# Patient Record
Sex: Male | Born: 1955 | Race: White | Hispanic: No | Marital: Married | Smoking: Never smoker
Health system: Southern US, Community
[De-identification: ages and names within clinical notes are randomized; demographics above are authoritative.]

## PROBLEM LIST (undated history)

## (undated) DIAGNOSIS — N200 Calculus of kidney: Secondary | ICD-10-CM

## (undated) DIAGNOSIS — K219 Gastro-esophageal reflux disease without esophagitis: Secondary | ICD-10-CM

---

## 2000-12-04 ENCOUNTER — Emergency Department (HOSPITAL_COMMUNITY): Admission: EM | Admit: 2000-12-04 | Discharge: 2000-12-04 | Payer: Self-pay

## 2006-05-19 ENCOUNTER — Emergency Department (HOSPITAL_COMMUNITY): Admission: EM | Admit: 2006-05-19 | Discharge: 2006-05-19 | Payer: Self-pay | Admitting: Emergency Medicine

## 2009-04-12 ENCOUNTER — Emergency Department (HOSPITAL_COMMUNITY): Admission: EM | Admit: 2009-04-12 | Discharge: 2009-04-12 | Payer: Self-pay | Admitting: Emergency Medicine

## 2009-09-06 DIAGNOSIS — R059 Cough, unspecified: Secondary | ICD-10-CM | POA: Insufficient documentation

## 2009-09-06 DIAGNOSIS — R05 Cough: Secondary | ICD-10-CM

## 2009-09-06 DIAGNOSIS — N200 Calculus of kidney: Secondary | ICD-10-CM | POA: Insufficient documentation

## 2010-09-25 LAB — BASIC METABOLIC PANEL
CO2: 23 mEq/L (ref 19–32)
Calcium: 9.3 mg/dL (ref 8.4–10.5)
Chloride: 103 mEq/L (ref 96–112)
GFR calc Af Amer: >60 mL/min (ref >60)
Sodium: 136 mEq/L (ref 135–145)

## 2010-09-25 LAB — URINE MICROSCOPIC-ADD ON

## 2010-09-25 LAB — URINALYSIS, ROUTINE W REFLEX MICROSCOPIC
Bilirubin Urine: NEGATIVE
Glucose, UA: NEGATIVE mg/dL
Ketones, ur: NEGATIVE mg/dL
Specific Gravity, Urine: 1.012 (ref 1.005–1.030)
pH: 7 (ref 5.0–8.0)

## 2013-05-22 ENCOUNTER — Ambulatory Visit
Admission: RE | Admit: 2013-05-22 | Discharge: 2013-05-22 | Disposition: A | Discharge status: Home / Self Care | Payer: 59 | Source: Ambulatory Visit | Attending: Family Medicine | Admitting: Family Medicine

## 2013-05-22 ENCOUNTER — Other Ambulatory Visit: Payer: Self-pay | Admitting: Family Medicine

## 2013-05-22 DIAGNOSIS — N5089 Other specified disorders of the male genital organs: Secondary | ICD-10-CM

## 2016-10-18 DIAGNOSIS — R5383 Other fatigue: Secondary | ICD-10-CM | POA: Diagnosis not present

## 2016-10-18 DIAGNOSIS — R1084 Generalized abdominal pain: Secondary | ICD-10-CM | POA: Diagnosis not present

## 2016-10-25 DIAGNOSIS — R1084 Generalized abdominal pain: Secondary | ICD-10-CM | POA: Diagnosis not present

## 2016-10-26 DIAGNOSIS — R1084 Generalized abdominal pain: Secondary | ICD-10-CM | POA: Diagnosis not present

## 2016-12-28 ENCOUNTER — Encounter (HOSPITAL_COMMUNITY): Payer: Self-pay | Admitting: Emergency Medicine

## 2016-12-28 DIAGNOSIS — N1330 Unspecified hydronephrosis: Secondary | ICD-10-CM | POA: Diagnosis not present

## 2016-12-28 DIAGNOSIS — N201 Calculus of ureter: Secondary | ICD-10-CM | POA: Insufficient documentation

## 2016-12-28 DIAGNOSIS — R1032 Left lower quadrant pain: Secondary | ICD-10-CM | POA: Diagnosis not present

## 2016-12-28 NOTE — ED Triage Notes (Signed)
Pt states about 45 minutes ago he had a sudden onset of pain in his left flank  Pt states he had nausea with it  Pt states the pain has eased off now but he has hx of kidney stones

## 2016-12-29 ENCOUNTER — Emergency Department (HOSPITAL_COMMUNITY): Payer: 59

## 2016-12-29 ENCOUNTER — Emergency Department (HOSPITAL_COMMUNITY)
Admission: EM | Admit: 2016-12-29 | Discharge: 2016-12-29 | Disposition: A | Discharge status: Home / Self Care | Payer: 59 | Attending: Emergency Medicine | Admitting: Emergency Medicine

## 2016-12-29 DIAGNOSIS — N1330 Unspecified hydronephrosis: Secondary | ICD-10-CM | POA: Diagnosis not present

## 2016-12-29 DIAGNOSIS — N201 Calculus of ureter: Secondary | ICD-10-CM

## 2016-12-29 HISTORY — DX: Gastro-esophageal reflux disease without esophagitis: K21.9

## 2016-12-29 HISTORY — DX: Calculus of kidney: N20.0

## 2016-12-29 LAB — URINALYSIS, ROUTINE W REFLEX MICROSCOPIC
Bilirubin Urine: NEGATIVE
Glucose, UA: NEGATIVE mg/dL
HGB URINE DIPSTICK: NEGATIVE
KETONES UR: NEGATIVE mg/dL
LEUKOCYTES UA: NEGATIVE
Nitrite: NEGATIVE
PROTEIN: NEGATIVE mg/dL
Specific Gravity, Urine: 1.018 (ref 1.005–1.030)
pH: 6 (ref 5.0–8.0)

## 2016-12-29 MED ORDER — HYDROMORPHONE HCL 1 MG/ML IJ SOLN
1.0000 mg | Freq: Once | INTRAMUSCULAR | Status: AC
  Administered 2016-12-29: 1 mg via INTRAVENOUS
  Filled 2016-12-29: qty 1

## 2016-12-29 MED ORDER — HYDROMORPHONE HCL 4 MG PO TABS: 4.0000 mg | ORAL_TABLET | ORAL | 0 refills | Status: AC | PRN

## 2016-12-29 MED ORDER — SODIUM CHLORIDE 0.9 % IV SOLN
Freq: Once | INTRAVENOUS | Status: AC
  Administered 2016-12-29: 02:00:00 via INTRAVENOUS

## 2016-12-29 MED ORDER — ONDANSETRON 8 MG PO TBDP: 8.0000 mg | ORAL_TABLET | Freq: Three times a day (TID) | ORAL | 0 refills | Status: AC | PRN

## 2016-12-29 MED ORDER — KETOROLAC TROMETHAMINE 30 MG/ML IJ SOLN
10.0000 mg | Freq: Once | INTRAMUSCULAR | Status: AC
  Administered 2016-12-29: 9.9 mg via INTRAVENOUS
  Filled 2016-12-29: qty 1

## 2016-12-29 MED ORDER — ONDANSETRON HCL 4 MG/2ML IJ SOLN
4.0000 mg | Freq: Once | INTRAMUSCULAR | Status: AC
  Administered 2016-12-29: 4 mg via INTRAVENOUS
  Filled 2016-12-29: qty 2

## 2016-12-29 NOTE — ED Notes (Signed)
Pt's wife to registration window and states her husband is laying in the lobby bathroom floor  Assisted pt to wheelchair  Pt is diaphoretic and states the pain in his flank area has returned  Pt is nauseated

## 2016-12-29 NOTE — ED Provider Notes (Signed)
[facility] DEPT Provider Note: Georgena Spurling, MD, FACEP  CSN: [id number] MRN: [id number] ARRIVAL: 12/28/16 at 61 ROOM: WA24/WA24   CHIEF COMPLAINT  Flank Pain   HISTORY OF PRESENT ILLNESS  Andres Tanner is a 61 y.o. male with a history of kidney stones. He is here with left flank pain that began about 2 hours ago. He rates the pain as severe, the worst has ever felt. The flank pain radiates into his left lower quadrant. It is not significantly changed with movement. He has had associated nausea, vomiting and diaphoresis. He is not aware of having a fever.  Consultation with the [facility] state controlled substances database reveals the patient has received no opioid prescriptions in the past year.   Past Medical History:  Diagnosis Date  . GERD (gastroesophageal reflux disease)   . Kidney stone     History reviewed. No pertinent surgical history.  Family History  Problem Relation Age of Onset  . Diabetes Mother   . CAD Mother   . Hypertension Mother   . Leukemia Father     Social History  Substance Use Topics  . Smoking status: Never Smoker  . Smokeless tobacco: Never Used  . Alcohol use Yes    Prior to Admission medications   Medication Sig Start Date End Date Taking? Authorizing Provider  fluticasone (FLONASE) 50 MCG/ACT nasal spray Place 1-2 sprays into both nostrils daily as needed for rhinitis.   Yes [provider]    Allergies Patient has no known allergies.   REVIEW OF SYSTEMS  Negative except as noted here or in the History of Present Illness.   PHYSICAL EXAMINATION  Initial Vital Signs Blood pressure (!) 176/101, pulse 80, temperature (!) 97.5 F (36.4 C), temperature source Oral, resp. rate 18, SpO2 98 %.  Examination General: Well-developed, well-nourished male in obvious discomfort; appearance consistent with age of record HENT: normocephalic; atraumatic Eyes: pupils equal, round and reactive to light; extraocular muscles  intact Neck: supple Heart: regular rate and rhythm Lungs: clear to auscultation bilaterally Abdomen: soft; nondistended; left lower quadrant tenderness; bowel sounds present GU: Left CVA tenderness Extremities: No deformity; full range of motion; pulses normal Neurologic: Awake, alert and oriented; motor function intact in all extremities and symmetric; no facial droop Skin: Clammy Psychiatric: Mildly agitated   RESULTS  Summary of this visit's results, reviewed by myself:   EKG Interpretation  Date/Time:    Ventricular Rate:    PR Interval:    QRS Duration:   QT Interval:    QTC Calculation:   R Axis:     Text Interpretation:        Laboratory Studies: Results for orders placed or performed during the hospital encounter of 12/29/16 (from the past 24 hour(s))  Urinalysis, Routine w reflex microscopic- may I&O cath if menses     Status: None   Collection Time: 12/28/16 11:49 PM  Result Value Ref Range   Color, Urine YELLOW YELLOW   APPearance CLEAR CLEAR   Specific Gravity, Urine 1.018 1.005 - 1.030   pH 6.0 5.0 - 8.0   Glucose, UA NEGATIVE NEGATIVE mg/dL   Hgb urine dipstick NEGATIVE NEGATIVE   Bilirubin Urine NEGATIVE NEGATIVE   Ketones, ur NEGATIVE NEGATIVE mg/dL   Protein, ur NEGATIVE NEGATIVE mg/dL   Nitrite NEGATIVE NEGATIVE   Leukocytes, UA NEGATIVE NEGATIVE   Imaging Studies: Ct Renal Stone Study  Result Date: 12/29/2016 CLINICAL DATA:  Left flank pain EXAM: CT ABDOMEN AND PELVIS WITHOUT CONTRAST TECHNIQUE: Multidetector  CT imaging of the abdomen and pelvis was performed following the standard protocol without IV contrast. COMPARISON:  04/12/2009 FINDINGS: Lower chest: Lung bases demonstrate streaky atelectasis at the left base. No focal consolidation or pleural effusion. Mild coronary artery calcification. Hepatobiliary: Subcentimeter hypodensity left hepatic lobe probably a cyst. No calcified gallstones or biliary dilatation. Pancreas: Unremarkable. No  pancreatic ductal dilatation or surrounding inflammatory changes. Spleen: Normal in size without focal abnormality. Adrenals/Urinary Tract: Adrenal glands are within normal limits. Moderate left perinephric fat stranding. Mild left hydronephrosis and hydroureter, secondary to a punctate stone at the left UVJ. Bladder otherwise normal Stomach/Bowel: Stomach is within normal limits. Appendix appears normal. No evidence of bowel wall thickening, distention, or inflammatory changes. Vascular/Lymphatic: Aortic atherosclerosis. No enlarged abdominal or pelvic lymph nodes. Reproductive: Prostate is unremarkable. Other: No free air or free fluid. Musculoskeletal: Degenerative changes. No acute or suspicious bone lesion. IMPRESSION: 1. Mild left hydronephrosis and hydroureter, secondary to a punctate stone at the left UVJ. There is moderate left perinephric fat stranding Electronically Signed   By: Donavan Foil M.D.   On: 12/29/2016 02:45    ED COURSE  Nursing notes and initial vitals signs, including pulse oximetry, reviewed.  Vitals:   12/28/16 2336 12/29/16 0152  BP: (!) 176/101 (!) 190/97  Pulse: 80 76  Resp: 18 18  Temp: (!) 97.5 F (36.4 C)   TempSrc: Oral   SpO2: 98% 95%   3:36 AM Pain well controlled this time. Patient advised of CT findings.  PROCEDURES    ED DIAGNOSES     ICD-10-CM   1. Ureterolithiasis N20.1        Ana Woodroof, MD 12/29/16 [phone]

## 2017-03-16 DIAGNOSIS — Z0000 Encounter for general adult medical examination without abnormal findings: Secondary | ICD-10-CM | POA: Diagnosis not present

## 2017-04-09 DIAGNOSIS — M5136 Other intervertebral disc degeneration, lumbar region: Secondary | ICD-10-CM | POA: Diagnosis not present

## 2017-04-09 DIAGNOSIS — M542 Cervicalgia: Secondary | ICD-10-CM | POA: Diagnosis not present

## 2017-04-09 DIAGNOSIS — M50322 Other cervical disc degeneration at C5-C6 level: Secondary | ICD-10-CM | POA: Diagnosis not present

## 2017-04-20 DIAGNOSIS — G8929 Other chronic pain: Secondary | ICD-10-CM | POA: Diagnosis not present

## 2017-04-20 DIAGNOSIS — M542 Cervicalgia: Secondary | ICD-10-CM | POA: Diagnosis not present

## 2017-04-20 DIAGNOSIS — M549 Dorsalgia, unspecified: Secondary | ICD-10-CM | POA: Diagnosis not present

## 2017-04-30 DIAGNOSIS — M549 Dorsalgia, unspecified: Secondary | ICD-10-CM | POA: Diagnosis not present

## 2017-04-30 DIAGNOSIS — G8929 Other chronic pain: Secondary | ICD-10-CM | POA: Diagnosis not present

## 2017-04-30 DIAGNOSIS — M542 Cervicalgia: Secondary | ICD-10-CM | POA: Diagnosis not present

## 2017-06-16 DIAGNOSIS — J329 Chronic sinusitis, unspecified: Secondary | ICD-10-CM | POA: Diagnosis not present

## 2017-07-12 IMAGING — CT CT RENAL STONE PROTOCOL
2 of 3 series · 16 of 46 positions shown, 18 images · non-contrast
Comparison: 04/12/2009

CLINICAL DATA: Left flank pain

EXAM:
CT ABDOMEN AND PELVIS WITHOUT CONTRAST
TECHNIQUE: Multidetector CT imaging of the abdomen and pelvis was performed
following the standard protocol without IV contrast.

[Series 3: lung · axial · 0.74mm/px · z∈[-46,+64]mm · 13 of 65 slices shown, 15 images]
[im 5/65  soft-tissue]
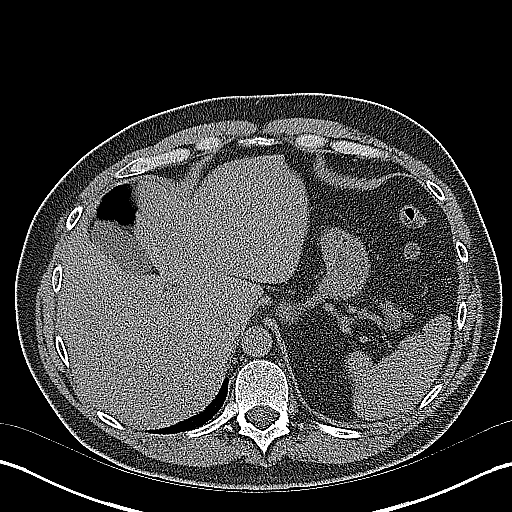
[im 5/65  bone]
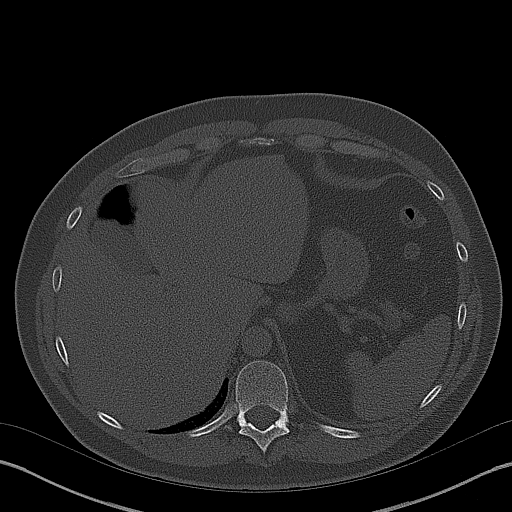
[im 9/65  soft-tissue]
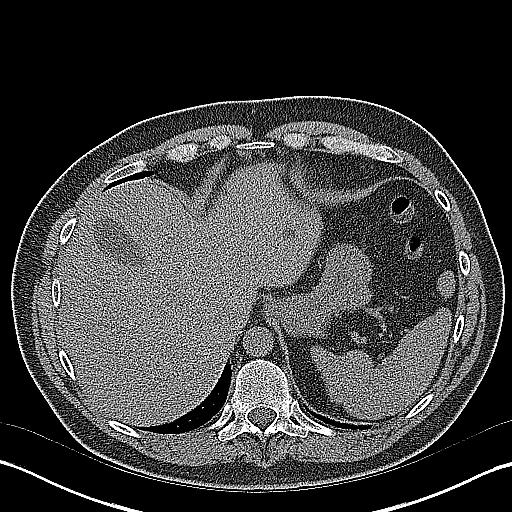
[im 13/65  soft-tissue]
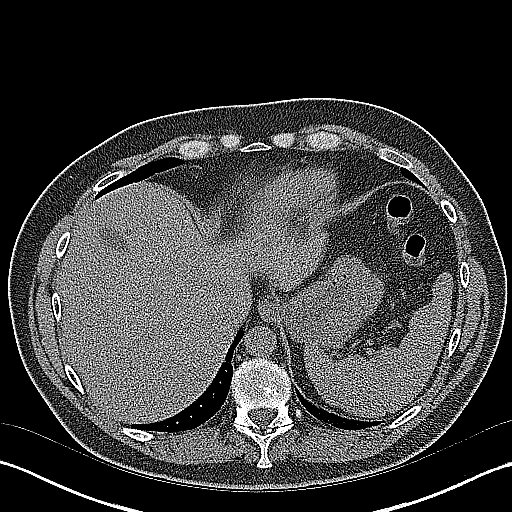
[im 19/65  soft-tissue]
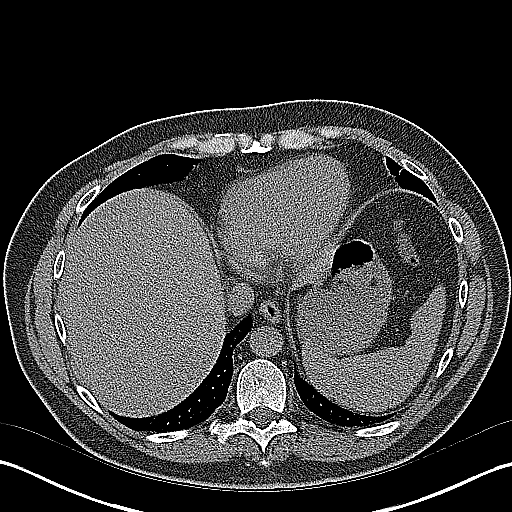
[im 23/65  soft-tissue]
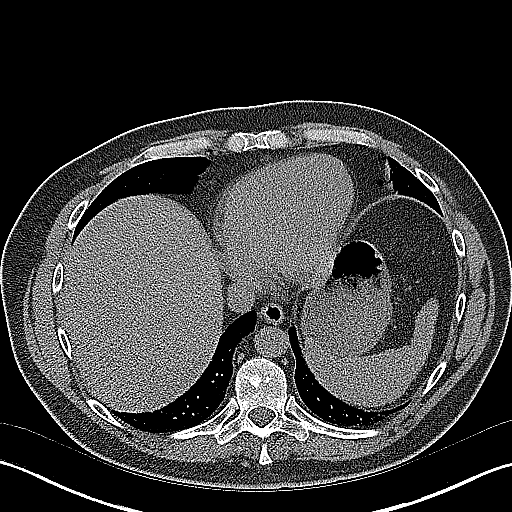
[im 27/65  soft-tissue]
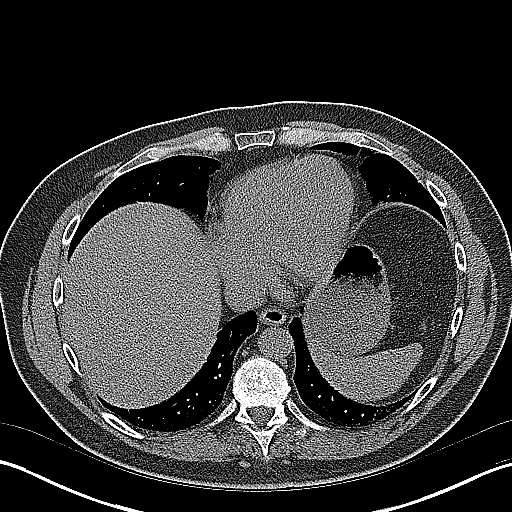
[im 34/65  soft-tissue]
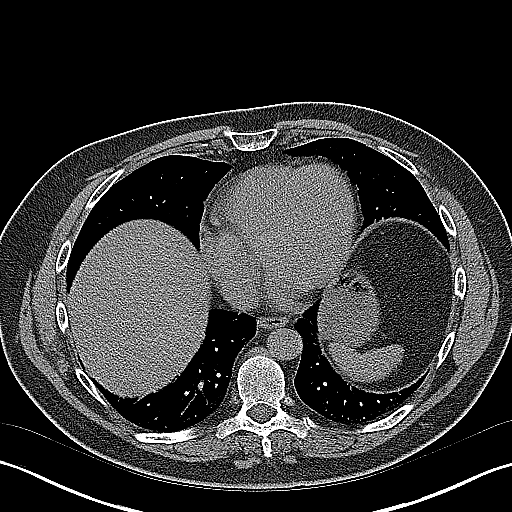
[im 38/65  soft-tissue]
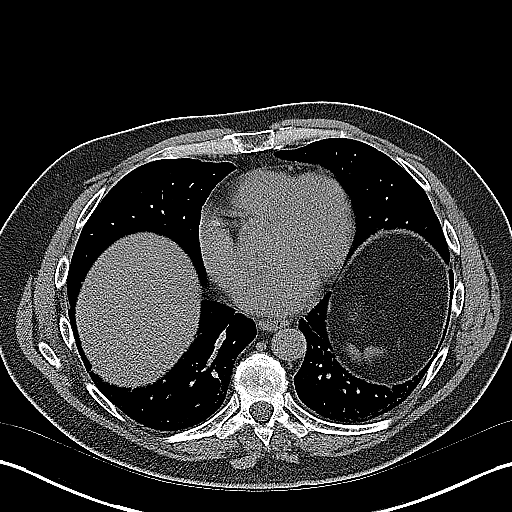
[im 42/65  soft-tissue]
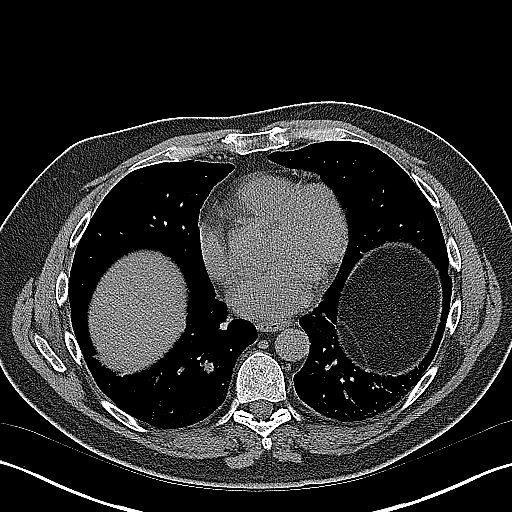
[im 42/65  bone]
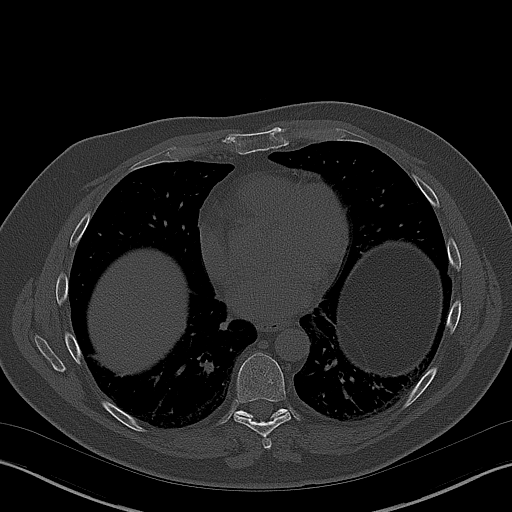
[im 46/65  soft-tissue]
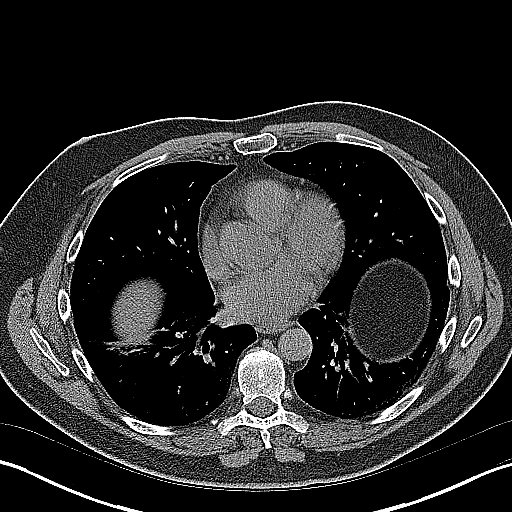
[im 52/65  soft-tissue]
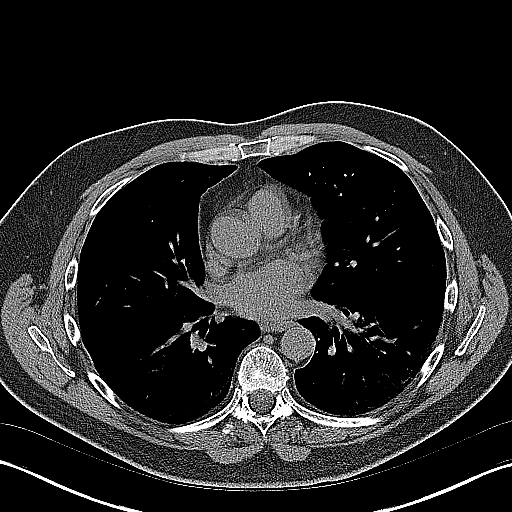
[im 56/65  soft-tissue]
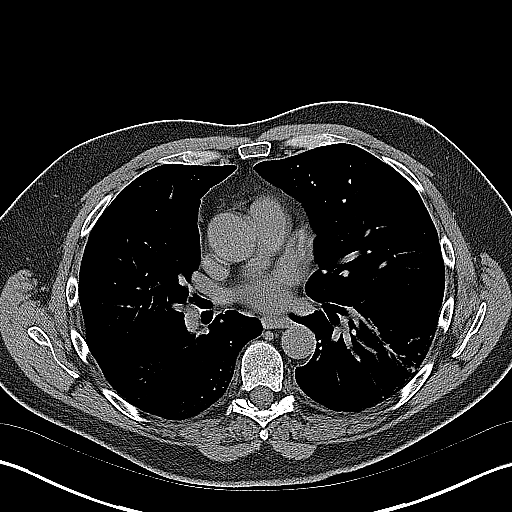
[im 60/65  soft-tissue]
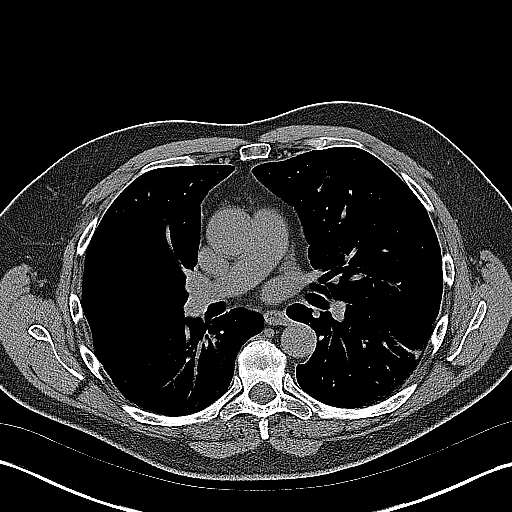

[Series 4: coronal · coronal · 0.68mm/px · 3 of 150 slices shown]
[im 50/150  soft-tissue]
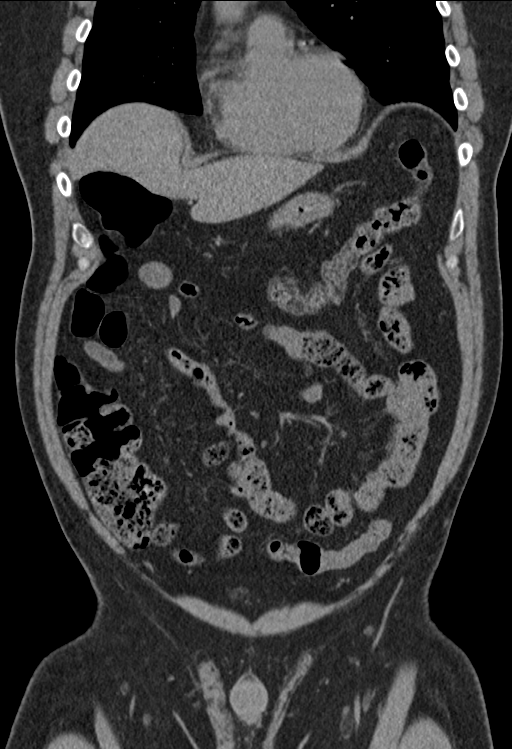
[im 67/150  soft-tissue]
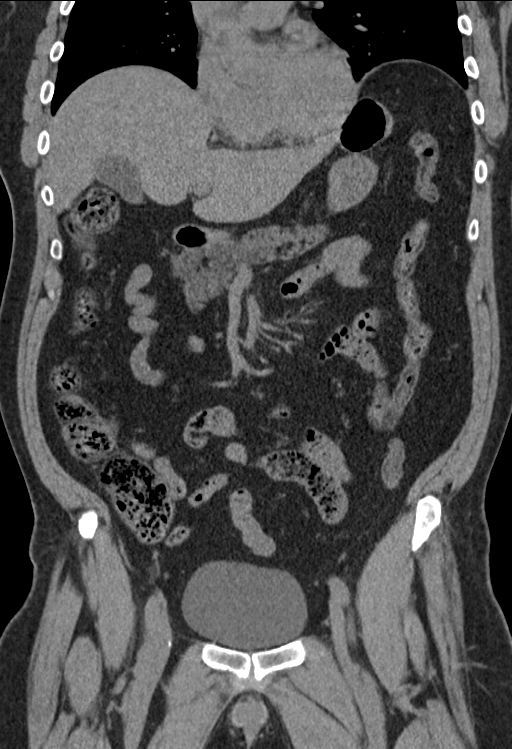
[im 83/150  soft-tissue]
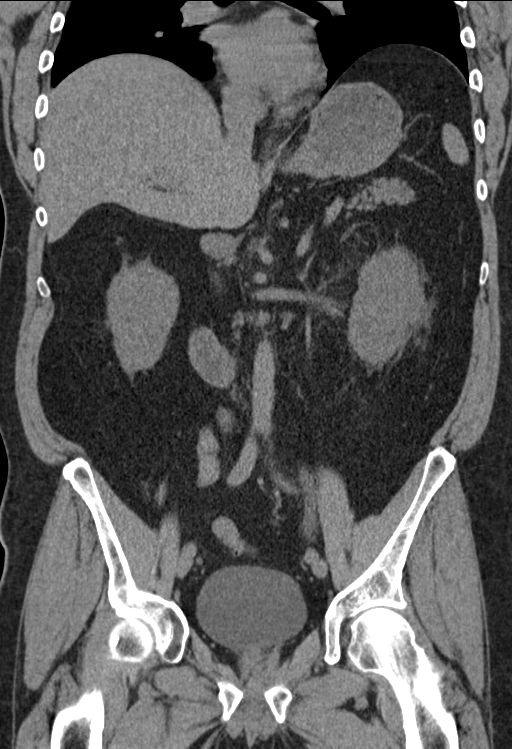

[16 of 46 positions shown; findings below may reference images not displayed]

FINDINGS: Lower chest: Lung bases demonstrate streaky atelectasis at the left
base. No focal consolidation or pleural effusion. Mild coronary
artery calcification.

Hepatobiliary: Subcentimeter hypodensity left hepatic lobe probably
a cyst. No calcified gallstones or biliary dilatation.

Pancreas: Unremarkable. No pancreatic ductal dilatation or
surrounding inflammatory changes.

Spleen: Normal in size without focal abnormality.

Adrenals/Urinary Tract: Adrenal glands are within normal limits.
Moderate left perinephric fat stranding. Mild left hydronephrosis
and hydroureter, secondary to a punctate stone at the left UVJ.
Bladder otherwise normal

Stomach/Bowel: Stomach is within normal limits. Appendix appears
normal. No evidence of bowel wall thickening, distention, or
inflammatory changes.

Vascular/Lymphatic: Aortic atherosclerosis. No enlarged abdominal or
pelvic lymph nodes.

Reproductive: Prostate is unremarkable.

Other: No free air or free fluid.

Musculoskeletal: Degenerative changes. No acute or suspicious bone
lesion.
IMPRESSION: 1. Mild left hydronephrosis and hydroureter, secondary to a punctate
stone at the left UVJ. There is moderate left perinephric fat
stranding

## 2017-09-01 DIAGNOSIS — H00012 Hordeolum externum right lower eyelid: Secondary | ICD-10-CM | POA: Diagnosis not present

## 2017-09-03 DIAGNOSIS — H00012 Hordeolum externum right lower eyelid: Secondary | ICD-10-CM | POA: Diagnosis not present

## 2017-11-11 DIAGNOSIS — I788 Other diseases of capillaries: Secondary | ICD-10-CM | POA: Diagnosis not present

## 2017-11-11 DIAGNOSIS — L400 Psoriasis vulgaris: Secondary | ICD-10-CM | POA: Diagnosis not present

## 2018-01-28 DIAGNOSIS — H2512 Age-related nuclear cataract, left eye: Secondary | ICD-10-CM | POA: Diagnosis not present

## 2018-01-28 DIAGNOSIS — H2513 Age-related nuclear cataract, bilateral: Secondary | ICD-10-CM | POA: Diagnosis not present

## 2018-01-28 DIAGNOSIS — H25013 Cortical age-related cataract, bilateral: Secondary | ICD-10-CM | POA: Diagnosis not present

## 2018-01-28 DIAGNOSIS — H18413 Arcus senilis, bilateral: Secondary | ICD-10-CM | POA: Diagnosis not present

## 2018-04-12 DIAGNOSIS — R030 Elevated blood-pressure reading, without diagnosis of hypertension: Secondary | ICD-10-CM | POA: Diagnosis not present

## 2018-04-12 DIAGNOSIS — Z0000 Encounter for general adult medical examination without abnormal findings: Secondary | ICD-10-CM | POA: Diagnosis not present

## 2018-04-12 DIAGNOSIS — R0981 Nasal congestion: Secondary | ICD-10-CM | POA: Diagnosis not present

## 2018-04-12 DIAGNOSIS — G479 Sleep disorder, unspecified: Secondary | ICD-10-CM | POA: Diagnosis not present

## 2018-10-31 DIAGNOSIS — J0100 Acute maxillary sinusitis, unspecified: Secondary | ICD-10-CM | POA: Diagnosis not present

## 2019-02-24 DIAGNOSIS — Z23 Encounter for immunization: Secondary | ICD-10-CM | POA: Diagnosis not present

## 2019-03-31 DIAGNOSIS — Z23 Encounter for immunization: Secondary | ICD-10-CM | POA: Diagnosis not present

## 2019-04-28 DIAGNOSIS — Z0000 Encounter for general adult medical examination without abnormal findings: Secondary | ICD-10-CM | POA: Diagnosis not present

## 2019-04-28 DIAGNOSIS — L570 Actinic keratosis: Secondary | ICD-10-CM | POA: Diagnosis not present

## 2019-04-28 DIAGNOSIS — G479 Sleep disorder, unspecified: Secondary | ICD-10-CM | POA: Diagnosis not present

## 2019-04-28 DIAGNOSIS — L409 Psoriasis, unspecified: Secondary | ICD-10-CM | POA: Diagnosis not present

## 2019-05-09 DIAGNOSIS — Z125 Encounter for screening for malignant neoplasm of prostate: Secondary | ICD-10-CM | POA: Diagnosis not present

## 2019-05-09 DIAGNOSIS — Z23 Encounter for immunization: Secondary | ICD-10-CM | POA: Diagnosis not present

## 2019-05-09 DIAGNOSIS — Z0000 Encounter for general adult medical examination without abnormal findings: Secondary | ICD-10-CM | POA: Diagnosis not present

## 2019-05-09 DIAGNOSIS — Z13220 Encounter for screening for lipoid disorders: Secondary | ICD-10-CM | POA: Diagnosis not present

## 2019-09-28 ENCOUNTER — Ambulatory Visit: Payer: Self-pay | Attending: Internal Medicine

## 2019-09-28 DIAGNOSIS — Z23 Encounter for immunization: Secondary | ICD-10-CM

## 2019-10-24 ENCOUNTER — Ambulatory Visit: Payer: Self-pay | Attending: Internal Medicine

## 2019-10-24 DIAGNOSIS — Z23 Encounter for immunization: Secondary | ICD-10-CM

## 2019-10-24 NOTE — Progress Notes (Signed)
   Covid-19 Vaccination Clinic  Name:  Andres Tanner    MRN: [id number] DOB: 1955-12-13  10/24/2019  Andres Tanner was observed post Covid-19 immunization for 15 minutes without incident. He was provided with Vaccine Information Sheet and instruction to access the V-Safe system.   Andres Tanner was instructed to call 911 with any severe reactions post vaccine: Marland Kitchen Difficulty breathing  . Swelling of face and throat  . A fast heartbeat  . A bad rash all over body  . Dizziness and weakness   Immunizations Administered    Name Date Dose VIS Date Route   Pfizer COVID-19 Vaccine 10/24/2019 10:57 AM 0.3 mL 08/16/2018 Intramuscular   Manufacturer: [place]   Lot: [id number]   [place]: [id number]

## 2020-03-22 DIAGNOSIS — H2512 Age-related nuclear cataract, left eye: Secondary | ICD-10-CM | POA: Diagnosis not present

## 2020-08-08 DIAGNOSIS — Z01812 Encounter for preprocedural laboratory examination: Secondary | ICD-10-CM | POA: Diagnosis not present

## 2020-11-14 DIAGNOSIS — K648 Other hemorrhoids: Secondary | ICD-10-CM | POA: Diagnosis not present

## 2020-11-14 DIAGNOSIS — D122 Benign neoplasm of ascending colon: Secondary | ICD-10-CM | POA: Diagnosis not present

## 2020-11-14 DIAGNOSIS — D123 Benign neoplasm of transverse colon: Secondary | ICD-10-CM | POA: Diagnosis not present

## 2020-11-14 DIAGNOSIS — Z1211 Encounter for screening for malignant neoplasm of colon: Secondary | ICD-10-CM | POA: Diagnosis not present

## 2020-11-14 DIAGNOSIS — K5730 Diverticulosis of large intestine without perforation or abscess without bleeding: Secondary | ICD-10-CM | POA: Diagnosis not present

## 2020-11-14 DIAGNOSIS — D120 Benign neoplasm of cecum: Secondary | ICD-10-CM | POA: Diagnosis not present

## 2020-11-19 DIAGNOSIS — D120 Benign neoplasm of cecum: Secondary | ICD-10-CM | POA: Diagnosis not present

## 2020-11-19 DIAGNOSIS — D122 Benign neoplasm of ascending colon: Secondary | ICD-10-CM | POA: Diagnosis not present

## 2020-11-19 DIAGNOSIS — D123 Benign neoplasm of transverse colon: Secondary | ICD-10-CM | POA: Diagnosis not present

## 2021-05-18 DIAGNOSIS — R059 Cough, unspecified: Secondary | ICD-10-CM | POA: Diagnosis not present

## 2021-05-18 DIAGNOSIS — Z03818 Encounter for observation for suspected exposure to other biological agents ruled out: Secondary | ICD-10-CM | POA: Diagnosis not present

## 2021-05-18 DIAGNOSIS — J22 Unspecified acute lower respiratory infection: Secondary | ICD-10-CM | POA: Diagnosis not present

## 2021-05-18 DIAGNOSIS — R519 Headache, unspecified: Secondary | ICD-10-CM | POA: Diagnosis not present

## 2021-05-23 DIAGNOSIS — R051 Acute cough: Secondary | ICD-10-CM | POA: Diagnosis not present

## 2021-08-28 DIAGNOSIS — Z13220 Encounter for screening for lipoid disorders: Secondary | ICD-10-CM | POA: Diagnosis not present

## 2021-08-28 DIAGNOSIS — G479 Sleep disorder, unspecified: Secondary | ICD-10-CM | POA: Diagnosis not present

## 2021-08-28 DIAGNOSIS — I10 Essential (primary) hypertension: Secondary | ICD-10-CM | POA: Diagnosis not present

## 2021-08-28 DIAGNOSIS — D485 Neoplasm of uncertain behavior of skin: Secondary | ICD-10-CM | POA: Diagnosis not present

## 2021-08-28 DIAGNOSIS — L409 Psoriasis, unspecified: Secondary | ICD-10-CM | POA: Diagnosis not present

## 2021-09-11 DIAGNOSIS — Z13220 Encounter for screening for lipoid disorders: Secondary | ICD-10-CM | POA: Diagnosis not present

## 2021-09-11 DIAGNOSIS — Z136 Encounter for screening for cardiovascular disorders: Secondary | ICD-10-CM | POA: Diagnosis not present

## 2021-09-11 DIAGNOSIS — I10 Essential (primary) hypertension: Secondary | ICD-10-CM | POA: Diagnosis not present

## 2021-09-24 DIAGNOSIS — L570 Actinic keratosis: Secondary | ICD-10-CM | POA: Diagnosis not present

## 2021-09-24 DIAGNOSIS — C4441 Basal cell carcinoma of skin of scalp and neck: Secondary | ICD-10-CM | POA: Diagnosis not present

## 2021-10-20 DIAGNOSIS — G479 Sleep disorder, unspecified: Secondary | ICD-10-CM | POA: Diagnosis not present

## 2021-10-20 DIAGNOSIS — I10 Essential (primary) hypertension: Secondary | ICD-10-CM | POA: Diagnosis not present

## 2021-11-03 DIAGNOSIS — C4441 Basal cell carcinoma of skin of scalp and neck: Secondary | ICD-10-CM | POA: Diagnosis not present

## 2022-01-17 DIAGNOSIS — L0211 Cutaneous abscess of neck: Secondary | ICD-10-CM | POA: Diagnosis not present

## 2022-01-17 DIAGNOSIS — L089 Local infection of the skin and subcutaneous tissue, unspecified: Secondary | ICD-10-CM | POA: Diagnosis not present

## 2022-02-10 DIAGNOSIS — S61210A Laceration without foreign body of right index finger without damage to nail, initial encounter: Secondary | ICD-10-CM | POA: Diagnosis not present

## 2022-02-10 DIAGNOSIS — W260XXA Contact with knife, initial encounter: Secondary | ICD-10-CM | POA: Diagnosis not present

## 2022-08-24 DIAGNOSIS — I10 Essential (primary) hypertension: Secondary | ICD-10-CM | POA: Diagnosis not present

## 2022-08-24 DIAGNOSIS — Z23 Encounter for immunization: Secondary | ICD-10-CM | POA: Diagnosis not present

## 2022-08-24 DIAGNOSIS — E7800 Pure hypercholesterolemia, unspecified: Secondary | ICD-10-CM | POA: Diagnosis not present

## 2022-08-24 DIAGNOSIS — L570 Actinic keratosis: Secondary | ICD-10-CM | POA: Diagnosis not present

## 2022-08-24 DIAGNOSIS — Z0000 Encounter for general adult medical examination without abnormal findings: Secondary | ICD-10-CM | POA: Diagnosis not present

## 2022-09-21 DIAGNOSIS — Z85828 Personal history of other malignant neoplasm of skin: Secondary | ICD-10-CM | POA: Diagnosis not present

## 2022-09-21 DIAGNOSIS — L570 Actinic keratosis: Secondary | ICD-10-CM | POA: Diagnosis not present

## 2022-10-13 DIAGNOSIS — I10 Essential (primary) hypertension: Secondary | ICD-10-CM | POA: Diagnosis not present

## 2023-04-01 DIAGNOSIS — G5762 Lesion of plantar nerve, left lower limb: Secondary | ICD-10-CM | POA: Diagnosis not present

## 2023-04-01 DIAGNOSIS — M205X2 Other deformities of toe(s) (acquired), left foot: Secondary | ICD-10-CM | POA: Diagnosis not present

## 2023-04-01 DIAGNOSIS — M2042 Other hammer toe(s) (acquired), left foot: Secondary | ICD-10-CM | POA: Diagnosis not present

## 2023-04-01 DIAGNOSIS — M2041 Other hammer toe(s) (acquired), right foot: Secondary | ICD-10-CM | POA: Diagnosis not present

## 2023-04-15 DIAGNOSIS — G5762 Lesion of plantar nerve, left lower limb: Secondary | ICD-10-CM | POA: Diagnosis not present

## 2023-04-15 DIAGNOSIS — M2041 Other hammer toe(s) (acquired), right foot: Secondary | ICD-10-CM | POA: Diagnosis not present

## 2023-04-15 DIAGNOSIS — M2042 Other hammer toe(s) (acquired), left foot: Secondary | ICD-10-CM | POA: Diagnosis not present

## 2023-07-25 DIAGNOSIS — R5383 Other fatigue: Secondary | ICD-10-CM | POA: Diagnosis not present

## 2023-07-25 DIAGNOSIS — J069 Acute upper respiratory infection, unspecified: Secondary | ICD-10-CM | POA: Diagnosis not present

## 2023-07-25 DIAGNOSIS — Z20822 Contact with and (suspected) exposure to covid-19: Secondary | ICD-10-CM | POA: Diagnosis not present

## 2023-08-09 DIAGNOSIS — M79672 Pain in left foot: Secondary | ICD-10-CM | POA: Diagnosis not present

## 2023-08-23 DIAGNOSIS — E7800 Pure hypercholesterolemia, unspecified: Secondary | ICD-10-CM | POA: Diagnosis not present

## 2023-08-23 DIAGNOSIS — I10 Essential (primary) hypertension: Secondary | ICD-10-CM | POA: Diagnosis not present

## 2023-08-30 DIAGNOSIS — E7800 Pure hypercholesterolemia, unspecified: Secondary | ICD-10-CM | POA: Diagnosis not present

## 2023-08-30 DIAGNOSIS — I10 Essential (primary) hypertension: Secondary | ICD-10-CM | POA: Diagnosis not present

## 2023-08-30 DIAGNOSIS — Z0000 Encounter for general adult medical examination without abnormal findings: Secondary | ICD-10-CM | POA: Diagnosis not present

## 2023-08-30 DIAGNOSIS — G479 Sleep disorder, unspecified: Secondary | ICD-10-CM | POA: Diagnosis not present

## 2023-09-30 DIAGNOSIS — H10501 Unspecified blepharoconjunctivitis, right eye: Secondary | ICD-10-CM | POA: Diagnosis not present

## 2023-10-07 DIAGNOSIS — H00011 Hordeolum externum right upper eyelid: Secondary | ICD-10-CM | POA: Diagnosis not present

## 2023-10-14 DIAGNOSIS — H00011 Hordeolum externum right upper eyelid: Secondary | ICD-10-CM | POA: Diagnosis not present

## 2023-10-25 DIAGNOSIS — H00011 Hordeolum externum right upper eyelid: Secondary | ICD-10-CM | POA: Diagnosis not present

## 2023-11-02 DIAGNOSIS — H0011 Chalazion right upper eyelid: Secondary | ICD-10-CM | POA: Diagnosis not present

## 2023-11-02 DIAGNOSIS — Z961 Presence of intraocular lens: Secondary | ICD-10-CM | POA: Diagnosis not present

## 2023-11-08 DIAGNOSIS — M5442 Lumbago with sciatica, left side: Secondary | ICD-10-CM | POA: Diagnosis not present

## 2023-11-17 DIAGNOSIS — H0011 Chalazion right upper eyelid: Secondary | ICD-10-CM | POA: Diagnosis not present

## 2023-11-17 DIAGNOSIS — Z961 Presence of intraocular lens: Secondary | ICD-10-CM | POA: Diagnosis not present

## 2023-12-06 DIAGNOSIS — Z961 Presence of intraocular lens: Secondary | ICD-10-CM | POA: Diagnosis not present

## 2023-12-06 DIAGNOSIS — H0102B Squamous blepharitis left eye, upper and lower eyelids: Secondary | ICD-10-CM | POA: Diagnosis not present

## 2023-12-06 DIAGNOSIS — H0102A Squamous blepharitis right eye, upper and lower eyelids: Secondary | ICD-10-CM | POA: Diagnosis not present

## 2023-12-06 DIAGNOSIS — H0011 Chalazion right upper eyelid: Secondary | ICD-10-CM | POA: Diagnosis not present

## 2023-12-13 DIAGNOSIS — R519 Headache, unspecified: Secondary | ICD-10-CM | POA: Diagnosis not present

## 2023-12-16 DIAGNOSIS — R519 Headache, unspecified: Secondary | ICD-10-CM | POA: Diagnosis not present

## 2023-12-16 DIAGNOSIS — G479 Sleep disorder, unspecified: Secondary | ICD-10-CM | POA: Diagnosis not present

## 2023-12-16 DIAGNOSIS — H9201 Otalgia, right ear: Secondary | ICD-10-CM | POA: Diagnosis not present

## 2023-12-16 DIAGNOSIS — I10 Essential (primary) hypertension: Secondary | ICD-10-CM | POA: Diagnosis not present

## 2024-01-03 DIAGNOSIS — Z09 Encounter for follow-up examination after completed treatment for conditions other than malignant neoplasm: Secondary | ICD-10-CM | POA: Diagnosis not present

## 2024-01-03 DIAGNOSIS — K635 Polyp of colon: Secondary | ICD-10-CM | POA: Diagnosis not present

## 2024-01-03 DIAGNOSIS — D125 Benign neoplasm of sigmoid colon: Secondary | ICD-10-CM | POA: Diagnosis not present

## 2024-01-03 DIAGNOSIS — Z860101 Personal history of adenomatous and serrated colon polyps: Secondary | ICD-10-CM | POA: Diagnosis not present

## 2024-01-03 DIAGNOSIS — K5730 Diverticulosis of large intestine without perforation or abscess without bleeding: Secondary | ICD-10-CM | POA: Diagnosis not present

## 2024-07-02 ENCOUNTER — Emergency Department (HOSPITAL_COMMUNITY)

## 2024-07-02 ENCOUNTER — Emergency Department (HOSPITAL_COMMUNITY)
Admission: EM | Admit: 2024-07-02 | Discharge: 2024-07-03 | Disposition: A | Attending: Emergency Medicine | Admitting: Emergency Medicine

## 2024-07-02 DIAGNOSIS — R1032 Left lower quadrant pain: Secondary | ICD-10-CM | POA: Insufficient documentation

## 2024-07-02 DIAGNOSIS — K55069 Acute infarction of intestine, part and extent unspecified: Secondary | ICD-10-CM

## 2024-07-02 LAB — CBC WITH DIFFERENTIAL/PLATELET
Abs Immature Granulocytes: 0.02 K/uL (ref 0.00–0.07)
Basophils Absolute: 0.1 K/uL (ref 0.0–0.1)
Basophils Relative: 1 %
Eosinophils Absolute: 0.3 K/uL (ref 0.0–0.5)
Eosinophils Relative: 3 %
HCT: 43.1 % (ref 39.0–52.0)
Hemoglobin: 15 g/dL (ref 13.0–17.0)
Immature Granulocytes: 0 %
Lymphocytes Relative: 22 %
Lymphs Abs: 1.8 K/uL (ref 0.7–4.0)
MCH: 30.7 pg (ref 26.0–34.0)
MCHC: 34.8 g/dL (ref 30.0–36.0)
MCV: 88.3 fL (ref 80.0–100.0)
Monocytes Absolute: 0.7 K/uL (ref 0.1–1.0)
Monocytes Relative: 8 %
Neutro Abs: 5.2 K/uL (ref 1.7–7.7)
Neutrophils Relative %: 66 %
Platelets: 317 K/uL (ref 150–400)
RBC: 4.88 MIL/uL (ref 4.22–5.81)
RDW: 12.4 % (ref 11.5–15.5)
WBC: 8 K/uL (ref 4.0–10.5)
nRBC: 0 % (ref 0.0–0.2)

## 2024-07-02 LAB — COMPREHENSIVE METABOLIC PANEL WITH GFR
ALT: 17 U/L (ref 0–44)
AST: 21 U/L (ref 15–41)
Albumin: 4.2 g/dL (ref 3.5–5.0)
Alkaline Phosphatase: 86 U/L (ref 38–126)
Anion gap: 10 (ref 5–15)
BUN: 11 mg/dL (ref 8–23)
CO2: 26 mmol/L (ref 22–32)
Calcium: 10.1 mg/dL (ref 8.9–10.3)
Chloride: 103 mmol/L (ref 98–111)
Creatinine, Ser: 1.04 mg/dL (ref 0.61–1.24)
GFR, Estimated: >60 mL/min
Glucose, Bld: 105 mg/dL — ABNORMAL HIGH (ref 70–99)
Potassium: 4.4 mmol/L (ref 3.5–5.1)
Sodium: 139 mmol/L (ref 135–145)
Total Bilirubin: 0.8 mg/dL (ref 0.0–1.2)
Total Protein: 7.3 g/dL (ref 6.5–8.1)

## 2024-07-02 LAB — LIPASE, BLOOD: Lipase: 28 U/L (ref 11–51)

## 2024-07-02 MED ORDER — ONDANSETRON HCL 4 MG/2ML IJ SOLN
4.0000 mg | Freq: Once | INTRAMUSCULAR | Status: AC
  Administered 2024-07-02: 4 mg via INTRAVENOUS
  Filled 2024-07-02: qty 2

## 2024-07-02 MED ORDER — HYDROMORPHONE HCL 1 MG/ML IJ SOLN
0.5000 mg | Freq: Once | INTRAMUSCULAR | Status: AC
  Administered 2024-07-02: 0.5 mg via INTRAVENOUS
  Filled 2024-07-02: qty 1

## 2024-07-02 MED ORDER — IBUPROFEN 800 MG PO TABS: 800.0000 mg | ORAL_TABLET | Freq: Three times a day (TID) | ORAL | 1 refills | Status: AC | PRN

## 2024-07-02 MED ORDER — IOHEXOL 300 MG/ML  SOLN
100.0000 mL | Freq: Once | INTRAMUSCULAR | Status: AC | PRN
  Administered 2024-07-02: 100 mL via INTRAVENOUS

## 2024-07-02 MED ORDER — SODIUM CHLORIDE 0.9 % IV SOLN
2.0000 g | Freq: Once | INTRAVENOUS | Status: AC
  Administered 2024-07-02: 2 g via INTRAVENOUS
  Filled 2024-07-02: qty 20

## 2024-07-02 NOTE — Discharge Instructions (Signed)
 Follow-up with [facility] surgery

## 2024-07-02 NOTE — ED Provider Triage Note (Signed)
 Emergency Medicine Provider Triage Evaluation Note  Andres Tanner , a 69 y.o. male  was evaluated in triage.  Pt complains of LLQ started three days ago. Walk into [facility] and provided augmentin for suspected diverticulitis. Has taken 3 abx since. No NVD.  Here today as pain is worsening  Review of Systems  Positive: See hpi Negative:   Physical Exam  BP (!) 146/78 (BP Location: Left Arm)   Pulse 91   Temp 98.3 F (36.8 C) (Oral)   Resp 16   SpO2 97%  Gen:   Awake, no distress   Resp:  Normal effort  MSK:   Moves extremities without difficulty  Other:  LLQ abd pain  Medical Decision Making  Medically screening exam initiated at 5:30 PM.  Appropriate orders placed.  Andres Tanner was informed that the remainder of the evaluation will be completed by another provider, this initial triage assessment does not replace that evaluation, and the importance of remaining in the ED until their evaluation is complete.  Labs and CT ordered   Andres Tanner, Andres Tanner 07/02/24 1732

## 2024-07-02 NOTE — ED Provider Notes (Signed)
 " [place] EMERGENCY DEPARTMENT AT [facility] Provider Note   CSN: [id number] Arrival date & time: 07/02/24  1705     Patient presents with: Abdominal Pain   Andres Tanner is a 69 y.o. male.   Patient has a history of GERD.  Patient complains of left lower quadrant abdominal pain.  He was seen by a primary care provider who  treated the patient for diverticulitis and placed him on antibiotics.  Patient has been taking the antibiotics for 2 days but states the pain seems to be getting worse.  No vomiting no fever  The history is provided by the patient and medical records. No language interpreter was used.  Abdominal Pain Pain location:  LLQ Pain quality: aching   Pain radiates to:  Does not radiate Pain severity:  Moderate Onset quality:  Sudden Timing:  Constant Progression:  Worsening Chronicity:  New Context: not alcohol use   Relieved by:  Nothing Worsened by:  Nothing Ineffective treatments:  None tried Associated symptoms: no anorexia, no chest pain, no cough, no diarrhea, no fatigue and no hematuria        Prior to Admission medications  Medication Sig Start Date End Date Taking? Authorizing Provider  fluticasone (FLONASE) 50 MCG/ACT nasal spray Place 1-2 sprays into both nostrils daily as needed for rhinitis.    [provider]  HYDROmorphone  (DILAUDID ) 4 MG tablet Take 1 tablet (4 mg total) by mouth every 4 (four) hours as needed (for pain). 12/29/16   Molpus, Norleen, MD  ondansetron  (ZOFRAN  ODT) 8 MG disintegrating tablet Take 1 tablet (8 mg total) by mouth every 8 (eight) hours as needed for nausea or vomiting. 12/29/16   Molpus, Norleen, MD    Allergies: Patient has no known allergies.    Review of Systems  Constitutional:  Negative for appetite change and fatigue.  HENT:  Negative for congestion, ear discharge and sinus pressure.   Eyes:  Negative for discharge.  Respiratory:  Negative for cough.   Cardiovascular:  Negative for chest  pain.  Gastrointestinal:  Positive for abdominal pain. Negative for anorexia and diarrhea.  Genitourinary:  Negative for frequency and hematuria.  Musculoskeletal:  Negative for back pain.  Skin:  Negative for rash.  Neurological:  Negative for seizures and headaches.  Psychiatric/Behavioral:  Negative for hallucinations.     Updated Vital Signs BP (!) 149/62   Pulse 82   Temp 98.4 F (36.9 C)   Resp 16   SpO2 98%   Physical Exam Vitals and nursing note reviewed.  Constitutional:      Appearance: He is well-developed.  HENT:     Head: Normocephalic.     Nose: Nose normal.  Eyes:     General: No scleral icterus.    Conjunctiva/sclera: Conjunctivae normal.  Neck:     Thyroid : No thyromegaly.  Cardiovascular:     Rate and Rhythm: Normal rate and regular rhythm.     Heart sounds: No murmur heard.    No friction rub. No gallop.  Pulmonary:     Breath sounds: No stridor. No wheezing or rales.  Chest:     Chest wall: No tenderness.  Abdominal:     General: There is no distension.     Tenderness: There is no abdominal tenderness. There is no rebound.  Musculoskeletal:        General: Normal range of motion.     Cervical back: Neck supple.  Lymphadenopathy:     Cervical: No cervical adenopathy.  Skin:    Findings: No erythema or rash.  Neurological:     Mental Status: He is alert and oriented to person, place, and time.     Motor: No abnormal muscle tone.     Coordination: Coordination normal.  Psychiatric:        Behavior: Behavior normal.     (all labs ordered are listed, but only abnormal results are displayed) Labs Reviewed  COMPREHENSIVE METABOLIC PANEL WITH GFR - Abnormal; Notable for the following components:      Result Value   Glucose, Bld 105 (*)    All other components within normal limits  CBC WITH DIFFERENTIAL/PLATELET  LIPASE, BLOOD  URINALYSIS, ROUTINE W REFLEX MICROSCOPIC    EKG: None  Radiology: No results found.   Procedures    Medications Ordered in the ED  cefTRIAXone  (ROCEPHIN ) 2 g in sodium chloride  0.9 % 100 mL IVPB (2 g Intravenous New Bag/Given 07/02/24 2247)  HYDROmorphone  (DILAUDID ) injection 0.5 mg (0.5 mg Intravenous Given 07/02/24 2246)  ondansetron  (ZOFRAN ) injection 4 mg (4 mg Intravenous Given 07/02/24 2247)  iohexol  (OMNIPAQUE ) 300 MG/ML solution 100 mL (100 mLs Intravenous Contrast Given 07/02/24 2301)                                    Medical Decision Making Amount and/or Complexity of Data Reviewed Labs: ordered.  Risk Prescription drug management.   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  Patient is given anti-inflammatories and will follow-up with general surgery     Final diagnoses:  None    ED Discharge Orders     None          Suzette Pac, MD 07/02/24 2350  "

## 2024-07-02 NOTE — ED Triage Notes (Signed)
 Patient reports LLQ pain since Friday, denies fevers/n/v/dysuria/constipation. Patient is alert and oriented x 4. Airway patent, respirations even and unlabored. Skin normal, warm and dry.
